# Patient Record
Sex: Male | Born: 1935 | Race: White | Hispanic: No | Marital: Married | Smoking: Former smoker
Health system: Southern US, Community
[De-identification: ages and names within clinical notes are randomized; demographics above are authoritative.]

## PROBLEM LIST (undated history)

## (undated) DIAGNOSIS — M1990 Unspecified osteoarthritis, unspecified site: Secondary | ICD-10-CM

## (undated) DIAGNOSIS — I10 Essential (primary) hypertension: Secondary | ICD-10-CM

## (undated) DIAGNOSIS — R519 Headache, unspecified: Secondary | ICD-10-CM

## (undated) DIAGNOSIS — G459 Transient cerebral ischemic attack, unspecified: Secondary | ICD-10-CM

## (undated) DIAGNOSIS — Z87442 Personal history of urinary calculi: Secondary | ICD-10-CM

## (undated) DIAGNOSIS — M109 Gout, unspecified: Secondary | ICD-10-CM

## (undated) DIAGNOSIS — R51 Headache: Secondary | ICD-10-CM

## (undated) HISTORY — PX: COLONOSCOPY W/ POLYPECTOMY: SHX1380

## (undated) HISTORY — PX: TOTAL KNEE ARTHROPLASTY: SHX125

## (undated) HISTORY — PX: COLONOSCOPY: SHX174

## (undated) HISTORY — PX: TONSILLECTOMY: SUR1361

## (undated) HISTORY — PX: FOOT SURGERY: SHX648

---

## 1989-02-22 HISTORY — PX: KNEE ARTHROSCOPY: SHX127

## 2014-02-22 ENCOUNTER — Other Ambulatory Visit: Payer: Self-pay | Admitting: Orthopedic Surgery

## 2014-04-04 ENCOUNTER — Encounter (HOSPITAL_COMMUNITY): Payer: Self-pay | Admitting: Pharmacy Technician

## 2014-04-07 NOTE — Pre-Procedure Instructions (Signed)
David Stone  04/07/2014   Your procedure is scheduled on:  Monday, October 16.  [facility].  Call this number if you have problems the morning of surgery: [phone]   Remember:   Do not eat food or drink liquids after midnight Sunday, October 25.   Take these medicines the morning of surgery with A SIP OF WATER: allopurinol (ZYLOPRIM), loratadine (CLARITIN).               May take acetaminophen (TYLENOL) if needed.              Stop taking Vitamins and Herbal Products.     Do not wear jewelry, make-up or nail polish.  Do not wear lotions, powders, or perfumes.              Men may shave face and neck.  Do not bring valuables to the hospital.              [facility] is not responsible for any belongings or valuables.               Contacts, dentures or bridgework may not be worn into surgery.  Leave suitcase in the car. After surgery it may be brought to your room.  For patients admitted to the hospital, discharge time is determined by your treatment team.               Patients discharged the day of surgery will not be allowed to drive home.  Name and phone number of your driver: -   Special Instructions: Review  [name] - Preparing For Surgery.   Please read over the following fact sheets that you were given: Pain Booklet, Coughing and Deep Breathing and Surgical Site Infection Prevention

## 2014-04-08 ENCOUNTER — Ambulatory Visit (HOSPITAL_COMMUNITY)
Admission: RE | Admit: 2014-04-08 | Discharge: 2014-04-08 | Disposition: A | Discharge status: Home / Self Care | Payer: Medicare Other | Source: Ambulatory Visit | Attending: Orthopedic Surgery | Admitting: Orthopedic Surgery

## 2014-04-08 ENCOUNTER — Encounter (HOSPITAL_COMMUNITY): Payer: Self-pay

## 2014-04-08 ENCOUNTER — Encounter (HOSPITAL_COMMUNITY)
Admission: RE | Admit: 2014-04-08 | Discharge: 2014-04-08 | Disposition: A | Discharge status: Home / Self Care | Payer: Medicare Other | Source: Ambulatory Visit | Attending: Orthopedic Surgery | Admitting: Orthopedic Surgery

## 2014-04-08 DIAGNOSIS — Z01818 Encounter for other preprocedural examination: Secondary | ICD-10-CM | POA: Insufficient documentation

## 2014-04-08 DIAGNOSIS — M25562 Pain in left knee: Secondary | ICD-10-CM | POA: Diagnosis not present

## 2014-04-08 HISTORY — DX: Headache, unspecified: R51.9

## 2014-04-08 HISTORY — DX: Essential (primary) hypertension: I10

## 2014-04-08 HISTORY — DX: Unspecified osteoarthritis, unspecified site: M19.90

## 2014-04-08 HISTORY — DX: Headache: R51

## 2014-04-08 HISTORY — DX: Personal history of urinary calculi: Z87.442

## 2014-04-08 HISTORY — DX: Gout, unspecified: M10.9

## 2014-04-08 LAB — URINALYSIS, ROUTINE W REFLEX MICROSCOPIC
Bilirubin Urine: NEGATIVE
GLUCOSE, UA: NEGATIVE mg/dL
Hgb urine dipstick: NEGATIVE
Ketones, ur: NEGATIVE mg/dL
LEUKOCYTES UA: NEGATIVE
NITRITE: NEGATIVE
PH: 6 (ref 5.0–8.0)
Protein, ur: NEGATIVE mg/dL
SPECIFIC GRAVITY, URINE: 1.021 (ref 1.005–1.030)
Urobilinogen, UA: 0.2 mg/dL (ref 0.0–1.0)

## 2014-04-08 LAB — COMPREHENSIVE METABOLIC PANEL
ALBUMIN: 3.7 g/dL (ref 3.5–5.2)
ALK PHOS: 76 U/L (ref 39–117)
ALT: 22 U/L (ref 0–53)
AST: 24 U/L (ref 0–37)
Anion gap: 12 (ref 5–15)
BUN: 25 mg/dL — ABNORMAL HIGH (ref 6–23)
CO2: 26 mEq/L (ref 19–32)
Calcium: 9.2 mg/dL (ref 8.4–10.5)
Chloride: 106 mEq/L (ref 96–112)
Creatinine, Ser: 0.76 mg/dL (ref 0.50–1.35)
GFR calc Af Amer: >90 mL/min (ref >90)
GFR calc non Af Amer: 85 mL/min — ABNORMAL LOW (ref >90)
GLUCOSE: 88 mg/dL (ref 70–99)
POTASSIUM: 4.3 meq/L (ref 3.7–5.3)
SODIUM: 144 meq/L (ref 137–147)
Total Bilirubin: 0.3 mg/dL (ref 0.3–1.2)
Total Protein: 7.3 g/dL (ref 6.0–8.3)

## 2014-04-08 LAB — PROTIME-INR
INR: 0.96 (ref 0.00–1.49)
Prothrombin Time: 12.9 seconds (ref 11.6–15.2)

## 2014-04-08 LAB — CBC WITH DIFFERENTIAL/PLATELET
BASOS ABS: 0 10*3/uL (ref 0.0–0.1)
Basophils Relative: 1 % (ref 0–1)
EOS ABS: 0.4 10*3/uL (ref 0.0–0.7)
Eosinophils Relative: 7 % — ABNORMAL HIGH (ref 0–5)
HCT: 40.2 % (ref 39.0–52.0)
Hemoglobin: 14.4 g/dL (ref 13.0–17.0)
LYMPHS ABS: 1.8 10*3/uL (ref 0.7–4.0)
Lymphocytes Relative: 31 % (ref 12–46)
MCH: 34.6 pg — ABNORMAL HIGH (ref 26.0–34.0)
MCHC: 35.8 g/dL (ref 30.0–36.0)
MCV: 96.6 fL (ref 78.0–100.0)
Monocytes Absolute: 0.5 10*3/uL (ref 0.1–1.0)
Monocytes Relative: 8 % (ref 3–12)
NEUTROS PCT: 53 % (ref 43–77)
Neutro Abs: 3.1 10*3/uL (ref 1.7–7.7)
Platelets: 147 10*3/uL — ABNORMAL LOW (ref 150–400)
RBC: 4.16 MIL/uL — AB (ref 4.22–5.81)
RDW: 13.5 % (ref 11.5–15.5)
WBC: 5.8 10*3/uL (ref 4.0–10.5)

## 2014-04-09 LAB — URINE CULTURE: Colony Count: 3000

## 2014-04-17 MED ORDER — TRANEXAMIC ACID 100 MG/ML IV SOLN
1000.0000 mg | INTRAVENOUS | Status: DC
  Filled 2014-04-17: qty 10

## 2014-04-17 MED ORDER — CHLORHEXIDINE GLUCONATE 4 % EX LIQD
60.0000 mL | Freq: Once | CUTANEOUS | Status: DC
  Filled 2014-04-17: qty 60

## 2014-04-17 MED ORDER — BUPIVACAINE LIPOSOME 1.3 % IJ SUSP
20.0000 mL | Freq: Once | INTRAMUSCULAR | Status: DC
  Filled 2014-04-17: qty 20

## 2014-04-17 MED ORDER — CEFAZOLIN SODIUM-DEXTROSE 2-3 GM-% IV SOLR
2.0000 g | INTRAVENOUS | Status: AC
  Administered 2014-04-18: 2 g via INTRAVENOUS
  Filled 2014-04-17: qty 50

## 2014-04-18 ENCOUNTER — Encounter (HOSPITAL_COMMUNITY): Payer: Medicare Other | Admitting: Certified Registered"

## 2014-04-18 ENCOUNTER — Encounter (HOSPITAL_COMMUNITY)
Admission: RE | Disposition: A | Discharge status: Home / Self Care | Payer: Self-pay | Source: Ambulatory Visit | Attending: Orthopedic Surgery

## 2014-04-18 ENCOUNTER — Ambulatory Visit (HOSPITAL_COMMUNITY): Payer: Medicare Other | Admitting: Certified Registered"

## 2014-04-18 ENCOUNTER — Inpatient Hospital Stay (HOSPITAL_COMMUNITY)
Admission: RE | Admit: 2014-04-18 | Discharge: 2014-04-19 | DRG: 465 | Disposition: A | Discharge status: Home / Self Care | Payer: Medicare Other | Source: Ambulatory Visit | Attending: Orthopedic Surgery | Admitting: Orthopedic Surgery

## 2014-04-18 ENCOUNTER — Encounter (HOSPITAL_COMMUNITY): Payer: Self-pay | Admitting: Surgery

## 2014-04-18 DIAGNOSIS — M25562 Pain in left knee: Secondary | ICD-10-CM | POA: Diagnosis present

## 2014-04-18 DIAGNOSIS — M109 Gout, unspecified: Secondary | ICD-10-CM | POA: Diagnosis present

## 2014-04-18 DIAGNOSIS — Z96652 Presence of left artificial knee joint: Secondary | ICD-10-CM | POA: Diagnosis not present

## 2014-04-18 DIAGNOSIS — M24662 Ankylosis, left knee: Principal | ICD-10-CM | POA: Diagnosis present

## 2014-04-18 DIAGNOSIS — I10 Essential (primary) hypertension: Secondary | ICD-10-CM | POA: Diagnosis present

## 2014-04-18 DIAGNOSIS — Z87891 Personal history of nicotine dependence: Secondary | ICD-10-CM

## 2014-04-18 HISTORY — PX: EXCISIONAL TOTAL KNEE ARTHROPLASTY: SHX5015

## 2014-04-18 HISTORY — DX: Transient cerebral ischemic attack, unspecified: G45.9

## 2014-04-18 LAB — CBC
HCT: 37.3 % — ABNORMAL LOW (ref 39.0–52.0)
Hemoglobin: 13.2 g/dL (ref 13.0–17.0)
MCH: 34.2 pg — AB (ref 26.0–34.0)
MCHC: 35.4 g/dL (ref 30.0–36.0)
MCV: 96.6 fL (ref 78.0–100.0)
PLATELETS: 130 10*3/uL — AB (ref 150–400)
RBC: 3.86 MIL/uL — ABNORMAL LOW (ref 4.22–5.81)
RDW: 13.5 % (ref 11.5–15.5)
WBC: 6.9 10*3/uL (ref 4.0–10.5)

## 2014-04-18 LAB — CREATININE, SERUM
Creatinine, Ser: 0.7 mg/dL (ref 0.50–1.35)
GFR calc Af Amer: >90 mL/min (ref >90)
GFR calc non Af Amer: 88 mL/min — ABNORMAL LOW (ref >90)

## 2014-04-18 SURGERY — EXCISIONAL TOTAL KNEE ARTHROPLASTY
Anesthesia: General | Laterality: Left

## 2014-04-18 MED ORDER — HYDROMORPHONE HCL 1 MG/ML IJ SOLN
1.0000 mg | INTRAMUSCULAR | Status: DC | PRN
  Administered 2014-04-19: 1 mg via INTRAVENOUS
  Filled 2014-04-18: qty 1

## 2014-04-18 MED ORDER — PROMETHAZINE HCL 25 MG/ML IJ SOLN: 6.2500 mg | INTRAMUSCULAR | Status: DC | PRN

## 2014-04-18 MED ORDER — BUPIVACAINE HCL (PF) 0.25 % IJ SOLN
INTRAMUSCULAR | Status: AC
  Filled 2014-04-18: qty 30

## 2014-04-18 MED ORDER — OXYCODONE HCL 5 MG PO TABS
5.0000 mg | ORAL_TABLET | Freq: Once | ORAL | Status: AC | PRN
  Administered 2014-04-18: 5 mg via ORAL

## 2014-04-18 MED ORDER — METHOCARBAMOL 500 MG PO TABS
500.0000 mg | ORAL_TABLET | Freq: Four times a day (QID) | ORAL | Status: DC | PRN
  Administered 2014-04-18 – 2014-04-19 (×2): 500 mg via ORAL
  Filled 2014-04-18: qty 1

## 2014-04-18 MED ORDER — OXYCODONE HCL 5 MG/5ML PO SOLN: 5.0000 mg | Freq: Once | ORAL | Status: AC | PRN

## 2014-04-18 MED ORDER — MENTHOL 3 MG MT LOZG
1.0000 | LOZENGE | OROMUCOSAL | Status: DC | PRN
Start: 2014-04-18 — End: 2014-04-19

## 2014-04-18 MED ORDER — LORATADINE 10 MG PO TABS
10.0000 mg | ORAL_TABLET | Freq: Every day | ORAL | Status: DC
  Filled 2014-04-18: qty 1

## 2014-04-18 MED ORDER — PROPOFOL 10 MG/ML IV BOLUS
INTRAVENOUS | Status: AC
  Filled 2014-04-18: qty 20

## 2014-04-18 MED ORDER — SENNOSIDES-DOCUSATE SODIUM 8.6-50 MG PO TABS: 1.0000 | ORAL_TABLET | Freq: Every evening | ORAL | Status: DC | PRN

## 2014-04-18 MED ORDER — FENTANYL CITRATE 0.05 MG/ML IJ SOLN
50.0000 ug | Freq: Once | INTRAMUSCULAR | Status: AC
  Administered 2014-04-18: 50 ug via INTRAVENOUS

## 2014-04-18 MED ORDER — LIDOCAINE HCL (CARDIAC) 20 MG/ML IV SOLN
INTRAVENOUS | Status: AC
  Filled 2014-04-18: qty 5

## 2014-04-18 MED ORDER — PHENOL 1.4 % MT LIQD: 1.0000 | OROMUCOSAL | Status: DC | PRN

## 2014-04-18 MED ORDER — ZOLPIDEM TARTRATE 5 MG PO TABS: 5.0000 mg | ORAL_TABLET | Freq: Every evening | ORAL | Status: DC | PRN

## 2014-04-18 MED ORDER — ALLOPURINOL 300 MG PO TABS
300.0000 mg | ORAL_TABLET | Freq: Every day | ORAL | Status: DC
  Administered 2014-04-19: 300 mg via ORAL
  Filled 2014-04-18: qty 1

## 2014-04-18 MED ORDER — ARTIFICIAL TEARS OP OINT
TOPICAL_OINTMENT | OPHTHALMIC | Status: AC
  Filled 2014-04-18: qty 3.5

## 2014-04-18 MED ORDER — BUPIVACAINE LIPOSOME 1.3 % IJ SUSP
INTRAMUSCULAR | Status: DC | PRN
  Administered 2014-04-18: 20 mL

## 2014-04-18 MED ORDER — ALUM & MAG HYDROXIDE-SIMETH 200-200-20 MG/5ML PO SUSP: 30.0000 mL | ORAL | Status: DC | PRN

## 2014-04-18 MED ORDER — DIPHENHYDRAMINE HCL 12.5 MG/5ML PO ELIX: 12.5000 mg | ORAL_SOLUTION | ORAL | Status: DC | PRN

## 2014-04-18 MED ORDER — BUPIVACAINE-EPINEPHRINE (PF) 0.5% -1:200000 IJ SOLN
INTRAMUSCULAR | Status: DC | PRN
Start: 2014-04-18 — End: 2014-04-18
  Administered 2014-04-18: 25 mL via PERINEURAL

## 2014-04-18 MED ORDER — FENTANYL CITRATE 0.05 MG/ML IJ SOLN
INTRAMUSCULAR | Status: AC
  Administered 2014-04-18: 50 ug via INTRAVENOUS
  Filled 2014-04-18: qty 2

## 2014-04-18 MED ORDER — METHOCARBAMOL 1000 MG/10ML IJ SOLN
500.0000 mg | Freq: Four times a day (QID) | INTRAVENOUS | Status: DC | PRN
  Filled 2014-04-18: qty 5

## 2014-04-18 MED ORDER — CELECOXIB 200 MG PO CAPS
200.0000 mg | ORAL_CAPSULE | Freq: Two times a day (BID) | ORAL | Status: DC
  Administered 2014-04-18 – 2014-04-19 (×2): 200 mg via ORAL
  Filled 2014-04-18 (×3): qty 1

## 2014-04-18 MED ORDER — LACTATED RINGERS IV SOLN
INTRAVENOUS | Status: DC
  Administered 2014-04-18 (×2): via INTRAVENOUS

## 2014-04-18 MED ORDER — 0.9 % SODIUM CHLORIDE (POUR BTL) OPTIME
TOPICAL | Status: DC | PRN
  Administered 2014-04-18: 1000 mL

## 2014-04-18 MED ORDER — ENOXAPARIN SODIUM 30 MG/0.3ML ~~LOC~~ SOLN
30.0000 mg | Freq: Two times a day (BID) | SUBCUTANEOUS | Status: DC
  Administered 2014-04-19: 30 mg via SUBCUTANEOUS
  Filled 2014-04-18 (×3): qty 0.3

## 2014-04-18 MED ORDER — MIDAZOLAM HCL 2 MG/2ML IJ SOLN
INTRAMUSCULAR | Status: AC
  Filled 2014-04-18: qty 2

## 2014-04-18 MED ORDER — ARTIFICIAL TEARS OP OINT
TOPICAL_OINTMENT | OPHTHALMIC | Status: DC | PRN
  Administered 2014-04-18: 1 via OPHTHALMIC

## 2014-04-18 MED ORDER — ONDANSETRON HCL 4 MG/2ML IJ SOLN
INTRAMUSCULAR | Status: AC
  Filled 2014-04-18: qty 2

## 2014-04-18 MED ORDER — BISACODYL 5 MG PO TBEC: 5.0000 mg | DELAYED_RELEASE_TABLET | Freq: Every day | ORAL | Status: DC | PRN

## 2014-04-18 MED ORDER — FENTANYL CITRATE 0.05 MG/ML IJ SOLN
INTRAMUSCULAR | Status: AC
  Filled 2014-04-18: qty 5

## 2014-04-18 MED ORDER — ONDANSETRON HCL 4 MG PO TABS: 4.0000 mg | ORAL_TABLET | Freq: Four times a day (QID) | ORAL | Status: DC | PRN

## 2014-04-18 MED ORDER — OXYCODONE HCL 5 MG PO TABS
ORAL_TABLET | ORAL | Status: AC
  Filled 2014-04-18: qty 1

## 2014-04-18 MED ORDER — LOSARTAN POTASSIUM 25 MG PO TABS
25.0000 mg | ORAL_TABLET | Freq: Two times a day (BID) | ORAL | Status: DC
  Administered 2014-04-18 – 2014-04-19 (×2): 25 mg via ORAL
  Filled 2014-04-18 (×3): qty 1

## 2014-04-18 MED ORDER — OXYCODONE HCL ER 10 MG PO T12A
10.0000 mg | EXTENDED_RELEASE_TABLET | Freq: Two times a day (BID) | ORAL | Status: DC
  Administered 2014-04-18 – 2014-04-19 (×2): 10 mg via ORAL
  Filled 2014-04-18 (×2): qty 1

## 2014-04-18 MED ORDER — BUPIVACAINE-EPINEPHRINE (PF) 0.25% -1:200000 IJ SOLN
INTRAMUSCULAR | Status: AC
  Filled 2014-04-18: qty 30

## 2014-04-18 MED ORDER — BUPIVACAINE-EPINEPHRINE (PF) 0.5% -1:200000 IJ SOLN
INTRAMUSCULAR | Status: DC | PRN
  Administered 2014-04-18: 30 mL

## 2014-04-18 MED ORDER — ACETAMINOPHEN 325 MG PO TABS
650.0000 mg | ORAL_TABLET | Freq: Four times a day (QID) | ORAL | Status: DC | PRN
Start: 2014-04-18 — End: 2014-04-19

## 2014-04-18 MED ORDER — FLEET ENEMA 7-19 GM/118ML RE ENEM: 1.0000 | ENEMA | Freq: Once | RECTAL | Status: AC | PRN

## 2014-04-18 MED ORDER — ONDANSETRON HCL 4 MG/2ML IJ SOLN
INTRAMUSCULAR | Status: DC | PRN
  Administered 2014-04-18: 4 mg via INTRAVENOUS

## 2014-04-18 MED ORDER — ACETAMINOPHEN 650 MG RE SUPP
650.0000 mg | Freq: Four times a day (QID) | RECTAL | Status: DC | PRN
Start: 2014-04-18 — End: 2014-04-19

## 2014-04-18 MED ORDER — SODIUM CHLORIDE 0.9 % IV SOLN
INTRAVENOUS | Status: DC
  Administered 2014-04-18: 75 mL/h via INTRAVENOUS

## 2014-04-18 MED ORDER — PHENYLEPHRINE 40 MCG/ML (10ML) SYRINGE FOR IV PUSH (FOR BLOOD PRESSURE SUPPORT)
PREFILLED_SYRINGE | INTRAVENOUS | Status: AC
  Filled 2014-04-18: qty 10

## 2014-04-18 MED ORDER — TRANEXAMIC ACID 100 MG/ML IV SOLN
1000.0000 mg | INTRAVENOUS | Status: AC
  Administered 2014-04-18: 1000 mg via INTRAVENOUS
  Filled 2014-04-18: qty 10

## 2014-04-18 MED ORDER — CEFAZOLIN SODIUM-DEXTROSE 2-3 GM-% IV SOLR
2.0000 g | Freq: Four times a day (QID) | INTRAVENOUS | Status: AC
  Administered 2014-04-19: 2 g via INTRAVENOUS
  Filled 2014-04-18 (×2): qty 50

## 2014-04-18 MED ORDER — ACETAMINOPHEN 500 MG PO TABS
1000.0000 mg | ORAL_TABLET | Freq: Four times a day (QID) | ORAL | Status: AC
  Administered 2014-04-18 – 2014-04-19 (×4): 1000 mg via ORAL
  Filled 2014-04-18 (×4): qty 2

## 2014-04-18 MED ORDER — DOCUSATE SODIUM 100 MG PO CAPS
100.0000 mg | ORAL_CAPSULE | Freq: Two times a day (BID) | ORAL | Status: DC
  Administered 2014-04-18 – 2014-04-19 (×2): 100 mg via ORAL
  Filled 2014-04-18 (×3): qty 1

## 2014-04-18 MED ORDER — HYDROMORPHONE HCL 1 MG/ML IJ SOLN: 0.2500 mg | INTRAMUSCULAR | Status: DC | PRN

## 2014-04-18 MED ORDER — NIACIN 500 MG PO TABS
500.0000 mg | ORAL_TABLET | Freq: Every day | ORAL | Status: DC
  Administered 2014-04-18: 500 mg via ORAL
  Filled 2014-04-18 (×2): qty 1

## 2014-04-18 MED ORDER — BUPIVACAINE-EPINEPHRINE (PF) 0.5% -1:200000 IJ SOLN
INTRAMUSCULAR | Status: AC
  Filled 2014-04-18: qty 30

## 2014-04-18 MED ORDER — METOCLOPRAMIDE HCL 10 MG PO TABS: 5.0000 mg | ORAL_TABLET | Freq: Three times a day (TID) | ORAL | Status: DC | PRN

## 2014-04-18 MED ORDER — METHOCARBAMOL 500 MG PO TABS
ORAL_TABLET | ORAL | Status: AC
  Filled 2014-04-18: qty 1

## 2014-04-18 MED ORDER — ROCURONIUM BROMIDE 50 MG/5ML IV SOLN
INTRAVENOUS | Status: AC
  Filled 2014-04-18: qty 1

## 2014-04-18 MED ORDER — PROPOFOL 10 MG/ML IV BOLUS
INTRAVENOUS | Status: DC | PRN
  Administered 2014-04-18 (×2): 50 mg via INTRAVENOUS
  Administered 2014-04-18: 100 mg via INTRAVENOUS

## 2014-04-18 MED ORDER — ONDANSETRON HCL 4 MG/2ML IJ SOLN: 4.0000 mg | Freq: Four times a day (QID) | INTRAMUSCULAR | Status: DC | PRN

## 2014-04-18 MED ORDER — SODIUM CHLORIDE 0.9 % IV SOLN: INTRAVENOUS | Status: DC

## 2014-04-18 MED ORDER — PHENYLEPHRINE HCL 10 MG/ML IJ SOLN
INTRAMUSCULAR | Status: DC | PRN
  Administered 2014-04-18: 120 ug via INTRAVENOUS
  Administered 2014-04-18 (×2): 80 ug via INTRAVENOUS

## 2014-04-18 MED ORDER — OXYCODONE HCL 5 MG PO TABS
5.0000 mg | ORAL_TABLET | ORAL | Status: DC | PRN
  Administered 2014-04-18 – 2014-04-19 (×4): 10 mg via ORAL
  Filled 2014-04-18 (×4): qty 2

## 2014-04-18 MED ORDER — FENTANYL CITRATE 0.05 MG/ML IJ SOLN
INTRAMUSCULAR | Status: DC | PRN
  Administered 2014-04-18 (×3): 50 ug via INTRAVENOUS

## 2014-04-18 MED ORDER — METOCLOPRAMIDE HCL 5 MG/ML IJ SOLN: 5.0000 mg | Freq: Three times a day (TID) | INTRAMUSCULAR | Status: DC | PRN

## 2014-04-18 SURGICAL SUPPLY — 63 items
ATTUNE PSRP INSR SZ8 6 KNEE (Insert) ×2 IMPLANT
ATTUNE PSRP INSR SZ8 6MM KNEE (Insert) ×1 IMPLANT
BAG URINE DRAINAGE (UROLOGICAL SUPPLIES) IMPLANT
BANDAGE ESMARK 6X9 LF (GAUZE/BANDAGES/DRESSINGS) ×1 IMPLANT
BLADE SAW SGTL 83.5X18.5 (BLADE) IMPLANT
BLADE SAW SGTL NAR THIN XSHT (BLADE) IMPLANT
BNDG ESMARK 6X9 LF (GAUZE/BANDAGES/DRESSINGS) ×3
BOWL SMART MIX CTS (DISPOSABLE) IMPLANT
CONT SPEC 4OZ CLIKSEAL STRL BL (MISCELLANEOUS) IMPLANT
COVER BACK TABLE 24X17X13 BIG (DRAPES) IMPLANT
COVER SURGICAL LIGHT HANDLE (MISCELLANEOUS) ×3 IMPLANT
CUFF TOURNIQUET SINGLE 34IN LL (TOURNIQUET CUFF) ×3 IMPLANT
DRAPE EXTREMITY T 121X128X90 (DRAPE) ×3 IMPLANT
DRAPE INCISE IOBAN 66X45 STRL (DRAPES) ×6 IMPLANT
DRAPE PROXIMA HALF (DRAPES) ×3 IMPLANT
DRAPE U-SHAPE 47X51 STRL (DRAPES) ×3 IMPLANT
DRSG ADAPTIC 3X8 NADH LF (GAUZE/BANDAGES/DRESSINGS) ×3 IMPLANT
DRSG PAD ABDOMINAL 8X10 ST (GAUZE/BANDAGES/DRESSINGS) ×3 IMPLANT
DURAPREP 26ML APPLICATOR (WOUND CARE) ×6 IMPLANT
ELECT REM PT RETURN 9FT ADLT (ELECTROSURGICAL) ×3
ELECTRODE REM PT RTRN 9FT ADLT (ELECTROSURGICAL) ×1 IMPLANT
EVACUATOR 1/8 PVC DRAIN (DRAIN) IMPLANT
GAUZE SPONGE 4X4 12PLY STRL (GAUZE/BANDAGES/DRESSINGS) ×3 IMPLANT
GEL ULTRASOUND 20GR AQUASONIC (MISCELLANEOUS) IMPLANT
GLOVE BIOGEL PI IND STRL 7.5 (GLOVE) IMPLANT
GLOVE BIOGEL PI IND STRL 8.5 (GLOVE) ×2 IMPLANT
GLOVE BIOGEL PI INDICATOR 7.5 (GLOVE)
GLOVE BIOGEL PI INDICATOR 8.5 (GLOVE) ×4
GLOVE SURG ORTHO 7.0 STRL STRW (GLOVE) IMPLANT
GLOVE SURG ORTHO 8.0 STRL STRW (GLOVE) ×6 IMPLANT
GOWN STRL REUS W/ TWL LRG LVL3 (GOWN DISPOSABLE) ×2 IMPLANT
GOWN STRL REUS W/ TWL XL LVL3 (GOWN DISPOSABLE) ×2 IMPLANT
GOWN STRL REUS W/TWL LRG LVL3 (GOWN DISPOSABLE) ×4
GOWN STRL REUS W/TWL XL LVL3 (GOWN DISPOSABLE) ×4
HANDPIECE INTERPULSE COAX TIP (DISPOSABLE) ×2
HOOD PEEL AWAY FACE SHEILD DIS (HOOD) ×12 IMPLANT
KIT BASIN OR (CUSTOM PROCEDURE TRAY) ×3 IMPLANT
KIT ROOM TURNOVER OR (KITS) ×3 IMPLANT
MANIFOLD NEPTUNE II (INSTRUMENTS) ×3 IMPLANT
NEEDLE 18GX1X1/2 (RX/OR ONLY) (NEEDLE) IMPLANT
NEEDLE HYPO 25GX1X1/2 BEV (NEEDLE) ×3 IMPLANT
NS IRRIG 1000ML POUR BTL (IV SOLUTION) ×3 IMPLANT
PACK TOTAL JOINT (CUSTOM PROCEDURE TRAY) ×3 IMPLANT
PAD ARMBOARD 7.5X6 YLW CONV (MISCELLANEOUS) ×9 IMPLANT
PADDING CAST COTTON 6X4 STRL (CAST SUPPLIES) ×3 IMPLANT
SET HNDPC FAN SPRY TIP SCT (DISPOSABLE) ×1 IMPLANT
SPONGE GAUZE 4X4 12PLY STER LF (GAUZE/BANDAGES/DRESSINGS) ×3 IMPLANT
STAPLER VISISTAT 35W (STAPLE) ×3 IMPLANT
SUCTION FRAZIER TIP 10 FR DISP (SUCTIONS) ×3 IMPLANT
SUT VIC AB 0 CTB1 27 (SUTURE) ×6 IMPLANT
SUT VIC AB 1 CT1 27 (SUTURE) ×8
SUT VIC AB 1 CT1 27XBRD ANBCTR (SUTURE) ×4 IMPLANT
SUT VIC AB 2-0 CT1 27 (SUTURE) ×4
SUT VIC AB 2-0 CT1 TAPERPNT 27 (SUTURE) ×2 IMPLANT
SYR 20CC LL (SYRINGE) IMPLANT
SYR 20ML ECCENTRIC (SYRINGE) IMPLANT
SYR CONTROL 10ML LL (SYRINGE) ×6 IMPLANT
SYRINGE 10CC LL (SYRINGE) IMPLANT
TOWEL OR 17X24 6PK STRL BLUE (TOWEL DISPOSABLE) ×3 IMPLANT
TOWEL OR 17X26 10 PK STRL BLUE (TOWEL DISPOSABLE) ×3 IMPLANT
TRAY FOLEY CATH 16FRSI W/METER (SET/KITS/TRAYS/PACK) ×3 IMPLANT
TUBE ANAEROBIC SPECIMEN COL (MISCELLANEOUS) IMPLANT
WATER STERILE IRR 1000ML POUR (IV SOLUTION) IMPLANT

## 2014-04-18 NOTE — Progress Notes (Signed)
Orthopedic Tech Progress Note Patient Details:  David MornJohn Jansma August 26, 1933 [id number] Patient did not want overhead frame. Patient ID: David Stone, male   DOB: August 26, 1933, 80 y.o.   MRN: [id number]   [name], [name] 04/18/2014, 7:10 PM

## 2014-04-18 NOTE — Anesthesia Postprocedure Evaluation (Signed)
  Anesthesia Post-op Note  Patient: David Stone  Procedure(s) Performed: Procedure(s): EXCISIONAL TOTAL KNEE ARTHROPLASTY POLYEXCHANGE WITH SCAR TISSUE DEBRIDEMENT (Left)  Patient Location: PACU  Anesthesia Type:GA combined with regional for post-op pain  Level of Consciousness: awake, alert  and oriented  Airway and Oxygen Therapy: Patient Spontanous Breathing  Post-op Pain: none  Post-op Assessment: Post-op Vital signs reviewed  Post-op Vital Signs: Reviewed  Last Vitals:  Filed Vitals:   04/18/14 1330  BP:   Pulse: 63  Temp:   Resp: 13    Complications: No apparent anesthesia complications

## 2014-04-18 NOTE — Anesthesia Preprocedure Evaluation (Addendum)
Anesthesia Evaluation  Patient identified by MRN, date of birth, ID band Patient awake    Reviewed: Allergy & Precautions, H&P , NPO status , Patient's Chart, lab work & pertinent test results  Airway Mallampati: II  TM Distance: >3 FB Neck ROM: Full    Dental  (+) Teeth Intact, Dental Advisory Given   Pulmonary former smoker,          Cardiovascular hypertension, Pt. on medications     Neuro/Psych  Headaches, CVA negative psych ROS   GI/Hepatic negative GI ROS, Neg liver ROS,   Endo/Other  negative endocrine ROS  Renal/GU negative Renal ROS     Musculoskeletal  (+) Arthritis -,   Abdominal   Peds  Hematology negative hematology ROS (+)   Anesthesia Other Findings   Reproductive/Obstetrics                           Anesthesia Physical Anesthesia Plan  ASA: II  Anesthesia Plan: General   Post-op Pain Management:    Induction: Intravenous  Airway Management Planned: LMA  Additional Equipment:   Intra-op Plan:   Post-operative Plan:   Informed Consent: I have reviewed the patients History and Physical, chart, labs and discussed the procedure including the risks, benefits and alternatives for the proposed anesthesia with the patient or authorized representative who has indicated his/her understanding and acceptance.   Dental advisory given  Plan Discussed with: CRNA and Surgeon  Anesthesia Plan Comments:        Anesthesia Quick Evaluation

## 2014-04-18 NOTE — Anesthesia Procedure Notes (Addendum)
Anesthesia Regional Block:  Adductor canal block  Pre-Anesthetic Checklist: ,, timeout performed, Correct Patient, Correct Site, Correct Laterality, Correct Procedure, Correct Position, site marked, Risks and benefits discussed,  Surgical consent,  Pre-op evaluation,  At surgeon's request and post-op pain management  Laterality: Left  Prep: chloraprep       Needles:   Needle Type: Echogenic Needle     Needle Length: 9cm 9 cm Needle Gauge: 21 and 21 G    Additional Needles:  Procedures: ultrasound guided (picture in chart) Adductor canal block Narrative:  Start time: 04/18/2014 10:10 AM End time: 04/18/2014 10:18 AM Injection made incrementally with aspirations every 5 mL.  Performed by: Personally  Anesthesiologist: Marcene Duosobert Fitzgerald, MD  Additional Notes: Risks and benefits explained to pt. Prepped and draped in sterile fashion. Pt tolerated procedure well with no immediate complications. Total 25cc's 0.5% bupi with 1:200k epi.   Procedure Name: LMA Insertion Date/Time: 04/18/2014 10:31 AM Performed by: Jefm MilesENNIE, Arsenia Goracke E Pre-anesthesia Checklist: Patient identified, Emergency Drugs available, Suction available, Patient being monitored and Timeout performed Patient Re-evaluated:Patient Re-evaluated prior to inductionOxygen Delivery Method: Circle system utilized Preoxygenation: Pre-oxygenation with 100% oxygen Intubation Type: IV induction Ventilation: Mask ventilation without difficulty LMA: LMA inserted LMA Size: 5.0 Number of attempts: 1 Placement Confirmation: positive ETCO2 and breath sounds checked- equal and bilateral Tube secured with: Tape Dental Injury: Teeth and Oropharynx as per pre-operative assessment

## 2014-04-18 NOTE — H&P (Signed)
  David MornJohn Stone MRN:  [id number] DOB/SEX:  [birth year]/male  CHIEF COMPLAINT:  Painful left Knee  HISTORY: Patient is a 80 y.o. male presented with a history of pain in the left knee. Onset of symptoms was abrupt starting several months ago with rapidly worsening course since that time. Prior procedures on the knee include arthroplasty. Patient has been treated conservatively with over-the-counter NSAIDs and activity modification. Patient currently rates pain in the knee at 10 out of 10 with activity. There is pain at night.  PAST MEDICAL HISTORY: There are no active problems to display for this patient.  Past Medical History  Diagnosis Date  . Hypertension   . Stroke     TIA- cant raise Right arm   after 2006  . History of kidney stones   . Gout   . Arthritis   . Headache     after 63.   Past Surgical History  Procedure Laterality Date  . Foot surgery Right     x 2 for gout  . Colonoscopy    . Tonsillectomy    . Colonoscopy w/ polypectomy    . Knee arthroscopy  1990's    after injury     MEDICATIONS:   No prescriptions prior to admission    ALLERGIES:  No Known Allergies  REVIEW OF SYSTEMS:  A comprehensive review of systems was negative.   FAMILY HISTORY:  No family history on file.  SOCIAL HISTORY:   History  Substance Use Topics  . Smoking status: Former Smoker    Types: Cigars  . Smokeless tobacco: Not on file  . Alcohol Use: No     EXAMINATION:  Vital signs in last 24 hours:    General appearance: alert, cooperative and no distress Lungs: clear to auscultation bilaterally Heart: regular rate and rhythm, S1, S2 normal, no murmur, click, rub or gallop Abdomen: soft, non-tender; bowel sounds normal; no masses,  no organomegaly Extremities: extremities normal, atraumatic, no cyanosis or edema and Homans sign is negative, no sign of DVT Pulses: 2+ and symmetric Skin: Skin color, texture, turgor normal. No rashes or lesions  Musculoskeletal:  ROM 0-90,  Ligaments intact,  Imaging Review Plain radiographs demonstrate components in good alignment  Assessment/Plan: Left knee pain s/p tka   The patient has failed conservative treatment.  The clearance notes were reviewed.  After discussion with the patient it was felt that poly swap scar tissue debridement was indicated. The procedure,  risks, and benefits of total knee arthroplasty were presented and reviewed. The risks including but not limited to aseptic loosening, infection, blood clots, vascular injury, stiffness, patella tracking problems complications among others were discussed. The patient acknowledged the explanation, agreed to proceed with the plan.  David Stone 04/18/2014, 6:41 AM

## 2014-04-18 NOTE — Plan of Care (Signed)
Problem: Consults Goal: Diagnosis- Total Joint Replacement Primary Total Knee  L total knee replacement, poly exchange with scar tissue debridement

## 2014-04-18 NOTE — Therapy (Signed)
04/18/2014, 9:37 AM   DRUG ALERT  :  Re: IV Tranexamic Acid for surgery.    Per interview with patient and wife, Patient reports he did NOT have a CVA/TIA previously [2006].  It was never established, by his physician, that he had any evidence of an embolic episode as mentioned in a previous note.  This was mentioned once in a medical history, but has since, been dropped from ongoing medical records.  No contraindications noted for receiving IV Tranexamic acid as ordered for surgery.  Thank you.  Joi Leyva, Elisha HeadlandEarle J,  Pharm.D.

## 2014-04-18 NOTE — Progress Notes (Signed)
Orthopedic Tech Progress Note Patient Details:  David MornJohn Kuzniar [birth year] [id number] Applied CPM to LLE.  Left Bone Foam with pt.'s nurse. CPM Left Knee CPM Left Knee: On Left Knee Flexion (Degrees): 90 Left Knee Extension (Degrees): 0   [name], [name] 04/18/2014, 3:54 PM

## 2014-04-18 NOTE — Transfer of Care (Signed)
Immediate Anesthesia Transfer of Care Note  Patient: David Stone  Procedure(s) Performed: Procedure(s): EXCISIONAL TOTAL KNEE ARTHROPLASTY POLYEXCHANGE WITH SCAR TISSUE DEBRIDEMENT (Left)  Patient Location: PACU  Anesthesia Type:General  Level of Consciousness: awake  Airway & Oxygen Therapy: Patient Spontanous Breathing and Patient connected to nasal cannula oxygen  Post-op Assessment: Report given to PACU RN  Post vital signs: Reviewed and stable  Complications: No apparent anesthesia complications

## 2014-04-18 NOTE — Progress Notes (Signed)
Utilization review completed.  

## 2014-04-19 ENCOUNTER — Encounter (HOSPITAL_COMMUNITY): Payer: Self-pay | Admitting: General Practice

## 2014-04-19 LAB — BASIC METABOLIC PANEL
Anion gap: 13 (ref 5–15)
BUN: 17 mg/dL (ref 6–23)
CALCIUM: 8.6 mg/dL (ref 8.4–10.5)
CO2: 25 meq/L (ref 19–32)
Chloride: 105 mEq/L (ref 96–112)
Creatinine, Ser: 0.82 mg/dL (ref 0.50–1.35)
GFR calc Af Amer: >90 mL/min (ref >90)
GFR, EST NON AFRICAN AMERICAN: 83 mL/min — AB (ref >90)
GLUCOSE: 100 mg/dL — AB (ref 70–99)
Potassium: 3.9 mEq/L (ref 3.7–5.3)
SODIUM: 143 meq/L (ref 137–147)

## 2014-04-19 LAB — CBC
HCT: 34.1 % — ABNORMAL LOW (ref 39.0–52.0)
HEMOGLOBIN: 11.9 g/dL — AB (ref 13.0–17.0)
MCH: 34.6 pg — AB (ref 26.0–34.0)
MCHC: 34.9 g/dL (ref 30.0–36.0)
MCV: 99.1 fL (ref 78.0–100.0)
Platelets: 118 10*3/uL — ABNORMAL LOW (ref 150–400)
RBC: 3.44 MIL/uL — ABNORMAL LOW (ref 4.22–5.81)
RDW: 13.5 % (ref 11.5–15.5)
WBC: 5.6 10*3/uL (ref 4.0–10.5)

## 2014-04-19 MED ORDER — OXYCODONE HCL ER 10 MG PO T12A: 10.0000 mg | EXTENDED_RELEASE_TABLET | Freq: Two times a day (BID) | ORAL | Status: AC

## 2014-04-19 MED ORDER — METHOCARBAMOL 500 MG PO TABS: 500.0000 mg | ORAL_TABLET | Freq: Four times a day (QID) | ORAL | Status: AC | PRN

## 2014-04-19 MED ORDER — ENOXAPARIN SODIUM 40 MG/0.4ML ~~LOC~~ SOLN: 40.0000 mg | SUBCUTANEOUS | Status: AC

## 2014-04-19 MED ORDER — OXYCODONE HCL 5 MG PO TABS: 5.0000 mg | ORAL_TABLET | ORAL | Status: AC | PRN

## 2014-04-19 NOTE — Evaluation (Signed)
Occupational Therapy Evaluation Patient Details Name: David Stone MRN: [id number] DOB: 1934/05/29 Today's Date: 04/19/2014    History of Present Illness [name]   Clinical Impression   Pt admitted with the above diagnoses and presents with below problem list. Pt seen for acute OT eval and treat. PTA pt was independent with ADLs. Pt currently at supervision to min guard level with LB ADLs. ADL education provided to pt and spouse. No further OT needs indicated at this time.     Follow Up Recommendations  Supervision - Intermittent;No OT follow up    Equipment Recommendations  None recommended by OT;Other (comment) (pt has BSC)    Recommendations for Other Services       Precautions / Restrictions Precautions Precautions: Fall Precaution Comments: reviewed  Restrictions Weight Bearing Restrictions: Yes LLE Weight Bearing: Weight bearing as tolerated      Mobility Bed Mobility Overal bed mobility: Needs Assistance Bed Mobility: Supine to Sit     Supine to sit: Supervision     General bed mobility comments: up in chair  Transfers Overall transfer level: Needs assistance Equipment used: Rolling walker (2 wheeled) Transfers: Sit to/from Stand Sit to Stand: Supervision         General transfer comment: cues for hand placement    Balance Overall balance assessment: Needs assistance         Standing balance support: Bilateral upper extremity supported;During functional activity Standing balance-Leahy Scale: Fair                              ADL Overall ADL's : Needs assistance/impaired Eating/Feeding: Set up;Sitting   Grooming: Set up;Sitting;Standing   Upper Body Bathing: Set up;Sitting   Lower Body Bathing: Min guard;Sit to/from stand   Upper Body Dressing : Set up;Sitting   Lower Body Dressing: Min guard;Sit to/from stand;With adaptive equipment   Toilet Transfer: Min guard;Ambulation;RW (3n1 over  toilet)   Toileting- Clothing Manipulation and Hygiene: Min guard;Sit to/from stand   Tub/ Shower Transfer: Min guard;Ambulation;3 in 1;Rolling walker;Shower seat   Functional mobility during ADLs: Min guard;Rolling walker General ADL Comments: ADL education provided to pt and spouse. Discussed home set up and toilet/shower transfers.      Vision                     Perception     Praxis      Pertinent Vitals/Pain Pain Assessment: 0-10 Pain Score: 5  Pain Location: L knee Pain Descriptors / Indicators: Aching Pain Intervention(s): Limited activity within patient's tolerance;Monitored during session;Repositioned;Patient requesting pain meds-RN notified     Hand Dominance     Extremity/Trunk Assessment Upper Extremity Assessment Upper Extremity Assessment: Overall WFL for tasks assessed   Lower Extremity Assessment Lower Extremity Assessment: Defer to PT evaluation    Cervical / Trunk Assessment    Communication Communication Communication: No difficulties   Cognition Arousal/Alertness: Awake/alert Behavior During Therapy: WFL for tasks assessed/performed Overall Cognitive Status: Within Functional Limits for tasks assessed                     General Comments       Exercises       Shoulder Instructions      Home Living Family/patient expects to be discharged to:: Private residence Living Arrangements: Spouse/significant other Available Help at Discharge: Family;Available PRN/intermittently Type of Home: House Home Access: Stairs to enter Entergy CorporationEntrance Stairs-Number  of Steps: 3 Entrance Stairs-Rails: None Home Layout: Two level;Able to live on main level with bedroom/bathroom     Bathroom Shower/Tub: Producer, television/film/videoWalk-in shower   Bathroom Toilet: Standard Bathroom Accessibility: Yes How Accessible: Accessible via walker Home Equipment: Walker - 2 wheels;Bedside commode;Cane - single point;Shower seat - built Designer, fashion/clothingin;Adaptive equipment Adaptive Equipment:  Reacher        Prior Functioning/Environment Level of Independence: Independent             OT Diagnosis: Acute pain   OT Problem List: Impaired balance (sitting and/or standing);Decreased knowledge of use of DME or AE;Decreased knowledge of precautions;Pain   OT Treatment/Interventions:      OT Goals(Current goals can be found in the care plan section) Acute Rehab OT Goals   OT Frequency:     Barriers to D/C:            Co-evaluation              End of Session Equipment Utilized During Treatment: Gait belt;Rolling walker CPM Left Knee Additional Comments: off at 1245  Activity Tolerance: Patient tolerated treatment well Patient left: in chair;with call bell/phone within reach;with family/visitor present   Time: 7829-56211247-1304 OT Time Calculation (min): 17 min Charges:  OT General Charges $OT Visit: 1 Procedure OT Evaluation $Initial OT Evaluation Tier I: 1 Procedure OT Treatments $Self Care/Home Management : 8-22 mins G-Codes:    Pilar GrammesMathews, Janiyah Beery H 04/19/2014, 1:11 PM

## 2014-04-19 NOTE — Discharge Instructions (Signed)
Diet: As you were doing prior to hospitalization  ° °Activity:  Increase activity slowly as tolerated  °                No lifting or driving for 6 weeks ° °Shower:  May shower without a dressing once there is no drainage from your wound. °                Do NOT wash over the wound. °                °Dressing:  You may change your dressing on Wednesday °                   Then change the dressing daily with sterile 4"x4"s gauze dressing  °                   And TED hose for knees. ° °Weight Bearing:  Weight bearing as tolerated as taught in physical therapy.  Use a                                walker or Crutches as instructed. ° °To prevent constipation: you may use a stool softener such as - °              Colace ( over the counter) 100 mg by mouth twice a day  °              Drink plenty of fluids ( prune juice may be helpful) and high fiber foods °               Miralax ( over the counter) for constipation as needed.   ° °Precautions:  If you experience chest pain or shortness of breath - call 911 immediately               For transfer to the hospital emergency department!! °              If you develop a fever greater that 101 F, purulent drainage from wound,                             increased redness or drainage from wound, or calf pain -- Call the office. ° °Follow- Up Appointment:  Please call for an appointment to be seen on 05/03/14 °                                             [place] office:  [phone] °           [address] [place], [place] [address] °               ° ° °

## 2014-04-19 NOTE — Care Management Note (Signed)
CARE MANAGEMENT NOTE 04/19/2014  Patient:  Janee MornCKERMAN,Jaramie   Account Number:  [id number]  Date Initiated:  04/19/2014  Documentation initiated by:  Vance PeperBRADY,Dima Mini  Subjective/Objective Assessment:   80 yr old male admitted with arthrofirosis of left total knee. Patient underwent a left total knee poly exchange.     Action/Plan:   Case manager spoke with patient and wife. [facility], No changes.  Patient has rolling walker and 3in1.   Anticipated DC Date:  04/19/2014   Anticipated DC Plan:  HOME W HOME HEALTH SERVICES      DC Planning Services  CM consult      [facility]  [facility]ALTH  DURABLE MEDICAL EQUIPMENT   Choice offered to / List presented to:  C-1 Patient   DME arranged  CPM      DME agency  [employer]     HH arranged  HH-2 PT      [facility]  [facility]   Status of service:  Completed, signed off Medicare Important Message given?  NA - LOS <3 / Initial given by admissions (If response is "NO", the following Medicare IM given date fields will be blank) Date Medicare IM given:   Medicare IM given by:   Date Additional Medicare IM given:   Additional Medicare IM given by:    Discharge Disposition:  HOME W HOME HEALTH SERVICES  Per UR Regulation:  Reviewed for med. necessity/level of care/duration of stay  If discussed at Long Length of Stay Meetings, dates discussed:

## 2014-04-19 NOTE — Progress Notes (Signed)
Physical Therapy Treatment Patient Details Name: David Stone MRN: [id number] DOB: [birth year] Today's Date: 04/19/2014    History of Present Illness [name]    PT Comments    *Stair training completed. Pt ambulated 325' with RW independently. He is ready to DC home from PT standpoint. **  Follow Up Recommendations  Home health PT     Equipment Recommendations  None recommended by PT    Recommendations for Other Services       Precautions / Restrictions Precautions Precautions: Fall Precaution Comments: reviewed  Restrictions Weight Bearing Restrictions: Yes LLE Weight Bearing: Weight bearing as tolerated    Mobility  Bed Mobility Overal bed mobility: Modified Independent Bed Mobility: Sit to Supine     Supine to sit: Supervision Sit to supine: Modified independent (Device/Increase time)   General bed mobility comments: with rail  Transfers Overall transfer level: Modified independent Equipment used: Rolling walker (2 wheeled) Transfers: Sit to/from Stand Sit to Stand: Modified independent (Device/Increase time)            Ambulation/Gait Ambulation/Gait assistance: Modified independent (Device/Increase time) Ambulation Distance (Feet): 325 Feet Assistive device: Rolling walker (2 wheeled) Gait Pattern/deviations: Step-through pattern Gait velocity: WFL   General Gait Details: cues for posture   Stairs Stairs: Yes Stairs assistance: Supervision Stair Management: No rails;With walker;Step to pattern;Backwards Number of Stairs: 2 General stair comments: cues for technique  Wheelchair Mobility    Modified Rankin (Stroke Patients Only)       Balance Overall balance assessment: Needs assistance         Standing balance support: Bilateral upper extremity supported;During functional activity Standing balance-Leahy Scale: Fair                      Cognition Arousal/Alertness: Awake/alert Behavior  During Therapy: WFL for tasks assessed/performed Overall Cognitive Status: Within Functional Limits for tasks assessed                      Exercises      General Comments        Pertinent Vitals/Pain Pain Assessment: 0-10 Pain Score: 4  Pain Location: L knee Pain Descriptors / Indicators: Sore Pain Intervention(s): Limited activity within patient's tolerance;Monitored during session;Premedicated before session    Home Living Family/patient expects to be discharged to:: Private residence Living Arrangements: Spouse/significant other Available Help at Discharge: Family;Available PRN/intermittently Type of Home: House Home Access: Stairs to enter Entrance Stairs-Rails: None Home Layout: Two level;Able to live on main level with bedroom/bathroom Home Equipment: Dan HumphreysWalker - 2 wheels;Bedside commode;Cane - single point;Shower seat - built in;Adaptive equipment      Prior Function Level of Independence: Independent          PT Goals (current goals can now be found in the care plan section) Acute Rehab PT Goals Patient Stated Goal: ballroom/square dancing, fishing PT Goal Formulation: With patient/family Time For Goal Achievement: 05/03/14 Potential to Achieve Goals: Good Progress towards PT goals: Progressing toward goals    Frequency  7X/week    PT Plan Current plan remains appropriate    Co-evaluation             End of Session Equipment Utilized During Treatment: Gait belt Activity Tolerance: Patient tolerated treatment well Patient left: with call bell/phone within reach;with family/visitor present;in bed     Time: 1349-1404 PT Time Calculation (min): 15 min  Charges:  $Gait Training: 8-22 mins  G CodesRalene Bathe:      Ambera Fedele Kistler 04/19/2014, 2:09 PM [phone]

## 2014-04-19 NOTE — Progress Notes (Signed)
Lovenox teaching done with the Pt and his wife. They verbalized understanding of how to admin injection and felt comfortable with admin at discharge.

## 2014-04-19 NOTE — Progress Notes (Signed)
SPORTS MEDICINE AND JOINT REPLACEMENT  David SpurlingStephen Lucey, MD   David CabalMaurice Jamillia Closson, PA-C [address], [place], [place]  [address]                             [phone]   PROGRESS NOTE  Subjective:  negative for Chest Pain  negative for Shortness of Breath  negative for Nausea/Vomiting   negative for Calf Pain  negative for Bowel Movement   Tolerating Diet: yes         Patient reports pain as 4 on 0-10 scale.    Objective: Vital signs in last 24 hours:   Patient Vitals for the past 24 hrs:  BP Temp Pulse Resp SpO2  04/19/14 1219 119/48 mmHg 97.5 F (36.4 C) 72 18 97 %  04/19/14 1120 - - - 18 -  04/19/14 0800 - - - 18 -  04/19/14 0630 132/58 mmHg 98.5 F (36.9 C) 72 18 97 %  04/19/14 0400 - - - 18 97 %  04/19/14 0000 - - - 18 97 %  04/18/14 2000 - - - 18 97 %  04/18/14 1948 130/59 mmHg 98.5 F (36.9 C) 69 18 97 %  04/18/14 1655 123/55 mmHg 98.1 F (36.7 C) 73 18 96 %  04/18/14 1615 - - 65 18 97 %  04/18/14 1609 129/66 mmHg - - - -  04/18/14 1600 - 98.1 F (36.7 C) 63 18 97 %  04/18/14 1552 133/55 mmHg - - - -  04/18/14 1515 - - 72 19 98 %  04/18/14 1509 158/116 mmHg - - - -  04/18/14 1430 - - 62 18 99 %  04/18/14 1424 124/62 mmHg - - - -  04/18/14 1405 - - 62 16 100 %  04/18/14 1339 152/72 mmHg - 66 14 100 %  04/18/14 1330 - - 63 13 100 %    @flow {1959:LAST@   Intake/Output from previous day:   10/26 0701 - 10/27 0700 In: 2280 [P.O.:730; I.V.:1550] Out: 800 [Urine:500; Drains:200]   Intake/Output this shift:   10/27 0701 - 10/27 1900 In: 360 [P.O.:360] Out: 150 [Drains:150]   Intake/Output     10/26 0701 - 10/27 0700 10/27 0701 - 10/28 0700   P.O. 730 360   I.V. 1550    Total Intake 2280 360   Urine 500    Drains 200 150   Blood 100    Total Output 800 150   Net +1480 +210        Urine Occurrence 2 x       LABORATORY DATA:  Recent Labs  04/18/14 1818 04/19/14 0605  WBC 6.9 5.6  HGB 13.2 11.9*  HCT 37.3* 34.1*  PLT 130* 118*     Recent Labs  04/18/14 1818 04/19/14 0605  NA  --  143  K  --  3.9  CL  --  105  CO2  --  25  BUN  --  17  CREATININE 0.70 0.82  GLUCOSE  --  100*  CALCIUM  --  8.6   Lab Results  Component Value Date   INR 0.96 04/08/2014    Examination:  General appearance: alert, cooperative and no distress Extremities: Homans sign is negative, no sign of DVT  Wound Exam: clean, dry, intact   Drainage:  None: wound tissue dry  Motor Exam: EHL and FHL Intact  Sensory Exam: Deep Peroneal normal   Assessment:    1 Day Post-Op  Procedure(s) (LRB): EXCISIONAL TOTAL KNEE ARTHROPLASTY POLYEXCHANGE WITH SCAR TISSUE DEBRIDEMENT (Left)  ADDITIONAL DIAGNOSIS:  Active Problems:   Left knee pain  Acute Blood Loss Anemia   Plan: Physical Therapy as ordered Weight Bearing as Tolerated (WBAT)  DVT Prophylaxis:  Lovenox  DISCHARGE PLAN: Home  DISCHARGE NEEDS: HHPT, CPM, Walker and 3-in-1 comode seat         David Stone 04/19/2014, 1:27 PM

## 2014-04-19 NOTE — Op Note (Signed)
NAME:  David Stone, David Stone               ACCOUNT NO.:  [id number]  MEDICAL RECORD NO.:  [id number]  LOCATION:  [id number]                        FACILITY:  [facility]  PHYSICIAN:  Mila HomerStephen D. Sherlean FootLucey, M.D. DATE OF BIRTH:  05-Jul-1933  DATE OF PROCEDURE:  04/18/2014 DATE OF DISCHARGE:                              OPERATIVE REPORT   SURGEON:  Mila HomerStephen D. Sherlean FootLucey, M.D.  ASSISTANT:  Altamese CabalMaurice Jones, PA-C.  ANESTHESIA:  General.  PREOPERATIVE DIAGNOSIS:  Left failed total knee arthroplasty.  POSTOPERATIVE DIAGNOSIS:  Left failed total knee arthroplasty.  PROCEDURE:  Left knee arthroscopy with open synovectomy and revision 1 component.  INDICATION FOR PROCEDURE:  The patient is a 80 year old couple years status post a primary knee replacement.  He has left foot painful arthrofibrosis and informed consent was obtained.  DESCRIPTION OF PROCEDURE:  The patient was laid supine, administered general anesthesia left leg prepped and draped in usual fashion.  The extremity was exsanguinated with Esmarch and tourniquet inflated to 350 mmHg.  I then incised the incision along the length of the old incision with #10 blade.  I used the new 10 blade to make a median parapatellar arthrotomy.  I then used a #10 blade to perform a complete synovectomy in the joint, there was lots and lots of scar tissue.  At this point, I removed the polyethylene to get to the back of the knee and removed further scar tissue there.  I then irrigated, I placed another polyethylene back in the knee.  I then trialed with multiple sizes and I went down 1 size from 7 mm to 6 mm which I think helped to eliminate the 10-degree extension contracture and I did not feel it compromised our flexion gap and then once we had irrigated and then closed, his range of motion postoperatively was approximately 3-5 degrees from terminal extension back to at least 110 degrees if not 115 degrees of flexion versus his preoperative motion which was 10-80.   Again, once lavage was done, I then closed the arthrotomy with figure-of-eight #1 Vicryl sutures, buried 0 Vicryl sutures, subcuticular 2-0 Vicryl stitches, and skin staples.  Dressed with Xeroform, dressing sponges, sterile Webril, and Ted stocking.  COMPLICATIONS:  None.  DRAINS:  None.          ______________________________ Mila HomerStephen D. Sherlean FootLucey, M.D.     SDL/MEDQ  D:  04/19/2014  T:  04/19/2014  Job:  [id number]

## 2014-04-19 NOTE — Op Note (Signed)
Dictation Number:  [id number]

## 2014-04-19 NOTE — Evaluation (Addendum)
Physical Therapy Evaluation Patient Details Name: David Stone MRN: [id number] DOB: 10/03/1933 Today's Date: 04/19/2014   History of Present Illness  [name]; h/o L TKA October 2014  Clinical Impression  **Pt admitted with *[name]*. Pt currently with functional limitations due to the deficits listed below (see PT Problem List).  Pt will benefit from skilled PT to increase their independence and safety with mobility to allow discharge to the venue listed below.   Pt ambulated 200' with RW and supervision, reviewed L knee exercises. Pt doing well with mobility. Will do stair training later today, pt is now too groggy from IV pain medicine. Expect pt will be ready to DC home this afternoon from PT standpoint.  HHPT recommended.   *    Follow Up Recommendations Home health PT    Equipment Recommendations  None recommended by PT    Recommendations for Other Services       Precautions / Restrictions Precautions Precautions: Fall Restrictions LLE Weight Bearing: Weight bearing as tolerated      Mobility  Bed Mobility               General bed mobility comments: up in chair  Transfers Overall transfer level: Needs assistance   Transfers: Sit to/from Stand Sit to Stand: Supervision         General transfer comment: cues for hand placement  Ambulation/Gait Ambulation/Gait assistance: Supervision Ambulation Distance (Feet): 200 Feet Assistive device: Rolling walker (2 wheeled) Gait Pattern/deviations: Step-through pattern;Decreased weight shift to left Gait velocity: WFL   General Gait Details: cues for heel strike LLE and to flex L knee during swing phase  Stairs            Wheelchair Mobility    Modified Rankin (Stroke Patients Only)       Balance                                             Pertinent Vitals/Pain Pain Assessment: 0-10 Pain Score: 7  Pain  Location: L knee with walking Pain Descriptors / Indicators: Sore Pain Intervention(s): Limited activity within patient's tolerance;Monitored during session;Ice applied;Patient requesting pain meds-RN notified;Repositioned    Home Living Family/patient expects to be discharged to:: Private residence Living Arrangements: Spouse/significant other Available Help at Discharge: Family;Available PRN/intermittently Type of Home: House Home Access: Stairs to enter Entrance Stairs-Rails: None Entrance Stairs-Number of Steps: 3 Home Layout: Two level;Able to live on main level with bedroom/bathroom Home Equipment: Gilford Rile - 2 wheels;Bedside commode;Cane - single point      Prior Function Level of Independence: Independent               Hand Dominance        Extremity/Trunk Assessment   Upper Extremity Assessment: Overall WFL for tasks assessed           Lower Extremity Assessment: LLE deficits/detail   LLE Deficits / Details: knee extension AROM -15*, flexion AAROM 90*, SLR 3/5, ankle PF/DF WNL, knee ext -3/5  Cervical / Trunk Assessment: Kyphotic  Communication   Communication: No difficulties  Cognition Arousal/Alertness: Awake/alert Behavior During Therapy: WFL for tasks assessed/performed Overall Cognitive Status: Within Functional Limits for tasks assessed                      General Comments  Exercises Total Joint Exercises Ankle Circles/Pumps: AROM;Both;10 reps;Supine Quad Sets: AROM;Both;5 reps;Supine Towel Squeeze: AROM;Both;5 reps;Supine Heel Slides: AAROM;Left;10 reps;Supine Straight Leg Raises: AAROM;Left;5 reps Long Arc Quad: AROM;Left;5 reps;Seated Knee Flexion: AAROM;Left;5 reps;Seated Goniometric ROM: 15-90* L knee AAROM      Assessment/Plan    PT Assessment All further PT needs can be met in the next venue of care  PT Diagnosis Acute pain   PT Problem List Decreased strength;Decreased range of motion;Decreased activity  tolerance;Pain;Decreased mobility  PT Treatment Interventions     PT Goals (Current goals can be found in the Care Plan section) Acute Rehab PT Goals Patient Stated Goal: ballroom/square dancing, fishing PT Goal Formulation: With patient/family Time For Goal Achievement: 05/03/14 Potential to Achieve Goals: Good    Frequency     Barriers to discharge        Co-evaluation               End of Session Equipment Utilized During Treatment: Gait belt Activity Tolerance: Patient tolerated treatment well Patient left: in chair;with call bell/phone within reach;with family/visitor present Nurse Communication: Mobility status         Time: 8295-6213 PT Time Calculation (min): 37 min   Charges:   PT Evaluation $Initial PT Evaluation Tier I: 1 Procedure PT Treatments $Gait Training: 8-22 mins $Therapeutic Exercise: 8-22 mins   PT G Codes:          David Stone 04/19/2014, 10:00 AM [phone]

## 2014-04-21 NOTE — Addendum Note (Signed)
Addendum created 04/21/14 1454 by Kennieth Radobert E Dajion Bickford, MD   Modules edited: Anesthesia Attestations

## 2014-04-27 NOTE — Discharge Summary (Signed)
SPORTS MEDICINE & JOINT REPLACEMENT   Georgena Spurling, MD   Altamese Cabal, PA-C [address] [place], [place], [place]  [address]                             [phone]  PATIENT ID: David Stone        MRN:  [id number]          DOB/AGE: [birth year] / 78 y.o.    DISCHARGE SUMMARY  ADMISSION DATE:    04/18/2014 DISCHARGE DATE:  04/19/2014  ADMISSION DIAGNOSIS: arthrofibrosis left total knee    DISCHARGE DIAGNOSIS:  arthrofibrosis left total knee    ADDITIONAL DIAGNOSIS: Active Problems:   Left knee pain  Past Medical History  Diagnosis Date  . Hypertension   . History of kidney stones   . Gout   . Arthritis   . Headache     after 63.  Marland Kitchen TIA (transient ischemic attack)     PROCEDURE: Procedure(s): EXCISIONAL TOTAL KNEE ARTHROPLASTY POLYEXCHANGE WITH SCAR TISSUE DEBRIDEMENT on 04/18/2014  CONSULTS:     HISTORY:  See H&P in chart  HOSPITAL COURSE:  Robert Sperl is a 80 y.o. admitted on 04/18/2014 and found to have a diagnosis of arthrofibrosis left total knee.  After appropriate laboratory studies were obtained  they were taken to the operating room on 04/18/2014 and underwent Procedure(s): EXCISIONAL TOTAL KNEE ARTHROPLASTY POLYEXCHANGE WITH SCAR TISSUE DEBRIDEMENT.   They were given perioperative antibiotics:  Anti-infectives    Start     Dose/Rate Route Frequency Ordered Stop   04/18/14 1730  ceFAZolin (ANCEF) IVPB 2 g/50 mL premix     2 g100 mL/hr over 30 Minutes Intravenous Every 6 hours 04/18/14 1720 04/19/14 0529   04/18/14 0600  ceFAZolin (ANCEF) IVPB 2 g/50 mL premix     2 g100 mL/hr over 30 Minutes Intravenous On call to O.R. 04/17/14 1239 04/18/14 1035    .  Tolerated the procedure well.  Placed with a foley intraoperatively.  Given Ofirmev at induction and for 48 hours.    POD# 1: Vital signs were stable.  Patient denied Chest pain, shortness of breath, or calf pain.  Patient was started on Lovenox 30 mg subcutaneously twice daily at 8am.  Consults to  PT, OT, and care management were made.  The patient was weight bearing as tolerated.  CPM was placed on the operative leg 0-90 degrees for 6-8 hours a day.  Incentive spirometry was taught.  Dressing was changed.  Hemovac was discontinued.      POD #2, Continued  PT for ambulation and exercise program.  IV saline locked.  O2 discontinued.    The remainder of the hospital course was dedicated to ambulation and strengthening.   The patient was discharged on 1 day post op in  Good condition.  Blood products given:none  DIAGNOSTIC STUDIES: Recent vital signs: No data found.      Recent laboratory studies: No results for input(s): WBC, HGB, HCT, PLT in the last 168 hours. No results for input(s): NA, K, CL, CO2, BUN, CREATININE, GLUCOSE, CALCIUM in the last 168 hours. Lab Results  Component Value Date   INR 0.96 04/08/2014     Recent Radiographic Studies :  Dg Chest 2 View  04/08/2014   CLINICAL DATA:  Preop left knee surgery  EXAM: CHEST  2 VIEW  COMPARISON:  None  FINDINGS: Heart size appears normal. There is no pleural effusion or edema identified. No  airspace consolidation identified. The lung volumes appear low. There is a compression fracture within the lower thoracic spine with loss of approximately 30% of the vertebral body height. This is age indeterminate.  IMPRESSION: 1. No acute cardiopulmonary abnormalities. 2. Age-indeterminate lower thoracic spine compression fracture.   Electronically Signed   By: Signa Kellaylor  Stroud M.D.   On: 04/08/2014 13:36    DISCHARGE INSTRUCTIONS: Discharge Instructions    CPM    Complete by:  As directed   Continuous passive motion machine (CPM):      Use the CPM from 0 to 90 for 6-8 hours per day.      You may increase by 10 per day.  You may break it up into 2 or 3 sessions per day.      Use CPM for 2 weeks or until you are told to stop.     Call MD / Call 911    Complete by:  As directed   If you experience chest pain or shortness of breath,  CALL 911 and be transported to the hospital emergency room.  If you develope a fever above 101 F, pus (white drainage) or increased drainage or redness at the wound, or calf pain, call your surgeon's office.     Change dressing    Complete by:  As directed   Change dressing on wednesday, then change the dressing daily with sterile 4 x 4 inch gauze dressing and apply TED hose.     Constipation Prevention    Complete by:  As directed   Drink plenty of fluids.  Prune juice may be helpful.  You may use a stool softener, such as Colace (over the counter) 100 mg twice a day.  Use MiraLax (over the counter) for constipation as needed.     Diet - low sodium heart healthy    Complete by:  As directed      Do not put a pillow under the knee. Place it under the heel.    Complete by:  As directed      Driving restrictions    Complete by:  As directed   No driving for 6 weeks     Increase activity slowly as tolerated    Complete by:  As directed      Lifting restrictions    Complete by:  As directed   No lifting for 6 weeks     TED hose    Complete by:  As directed   Use stockings (TED hose) for 3 weeks on both leg(s).  You may remove them at night for sleeping.           DISCHARGE MEDICATIONS:     Medication List    TAKE these medications        acetaminophen 500 MG tablet  Commonly known as:  TYLENOL  Take 1,000 mg by mouth every 6 (six) hours as needed for moderate pain.     allopurinol 300 MG tablet  Commonly known as:  ZYLOPRIM  Take 300 mg by mouth daily.     aspirin EC 81 MG tablet  Take 81 mg by mouth daily.     enoxaparin 40 MG/0.4ML injection  Commonly known as:  LOVENOX  Inject 0.4 mLs (40 mg total) into the skin daily.     EYE DROPS ADVANCED RELIEF OP  Place 2 drops into both eyes daily as needed (for eye relief).     loratadine 10 MG tablet  Commonly known as:  CLARITIN  Take  10 mg by mouth daily.     losartan 25 MG tablet  Commonly known as:  COZAAR  Take 25  mg by mouth 2 (two) times daily.     methocarbamol 500 MG tablet  Commonly known as:  ROBAXIN  Take 1-2 tablets (500-1,000 mg total) by mouth every 6 (six) hours as needed for muscle spasms.     MULTIVITAMIN PO  Take 1 tablet by mouth daily.     niacin 500 MG tablet  Take 500 mg by mouth daily.     oxyCODONE 5 MG immediate release tablet  Commonly known as:  Oxy IR/ROXICODONE  Take 1-2 tablets (5-10 mg total) by mouth every 3 (three) hours as needed for breakthrough pain.     OxyCODONE 10 mg T12a 12 hr tablet  Commonly known as:  OXYCONTIN  Take 1 tablet (10 mg total) by mouth every 12 (twelve) hours.        FOLLOW UP VISIT:       Follow-up Information    [facility]   Why:  [facility] will contact you concerning start date and time for physical therapy.   Contact information:   [address] [place] [address] [phone]       Follow up with Raymon MuttonLUCEY,STEPHEN D, MD. Call on 05/03/2014.   Specialty:  Orthopedic Surgery   Contact information:   [address] [place] [address] [phone]       DISPOSITION: HOME  CONDITION:  Good   Keeli Roberg 04/27/2014, 9:40 PM

## 2016-07-12 ENCOUNTER — Other Ambulatory Visit: Payer: Self-pay | Admitting: Orthopedic Surgery

## 2016-07-12 DIAGNOSIS — G8929 Other chronic pain: Secondary | ICD-10-CM

## 2016-07-12 DIAGNOSIS — M25561 Pain in right knee: Principal | ICD-10-CM

## 2016-07-15 ENCOUNTER — Ambulatory Visit (INDEPENDENT_AMBULATORY_CARE_PROVIDER_SITE_OTHER): Payer: Medicare HMO

## 2016-07-15 DIAGNOSIS — M2241 Chondromalacia patellae, right knee: Secondary | ICD-10-CM | POA: Diagnosis not present

## 2016-07-15 DIAGNOSIS — M23231 Derangement of other medial meniscus due to old tear or injury, right knee: Secondary | ICD-10-CM | POA: Diagnosis not present

## 2016-07-15 DIAGNOSIS — G8929 Other chronic pain: Secondary | ICD-10-CM

## 2016-07-15 DIAGNOSIS — M7041 Prepatellar bursitis, right knee: Secondary | ICD-10-CM

## 2016-07-15 DIAGNOSIS — M25561 Pain in right knee: Principal | ICD-10-CM

## 2017-08-30 IMAGING — MR MR KNEE*R* W/O CM
5 series · 40 of 40 positions shown · non-contrast
Comparison: None.

CLINICAL DATA: Chronic right knee pain.  Six L with the

EXAM:
MRI OF THE RIGHT KNEE WITHOUT CONTRAST
TECHNIQUE: Multiplanar, multisequence MR imaging of the knee was performed. No
intravenous contrast was administered.

[Series 3: PD fat-sat · axial · 3.0mm · 0.33mm/px · z∈[-48,+68]mm · 8 of 36 slices shown (1 of 3)]
[im 1/36]
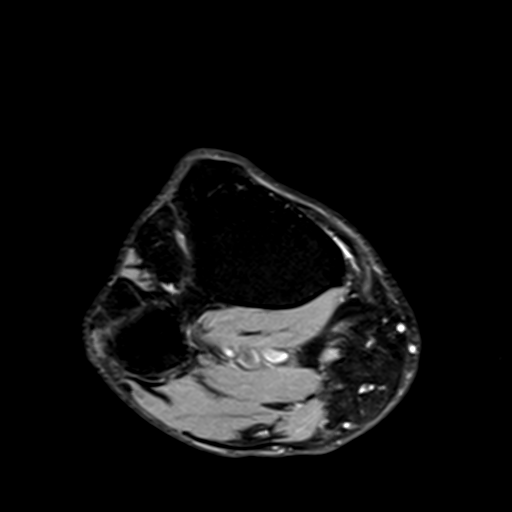
[im 6/36]
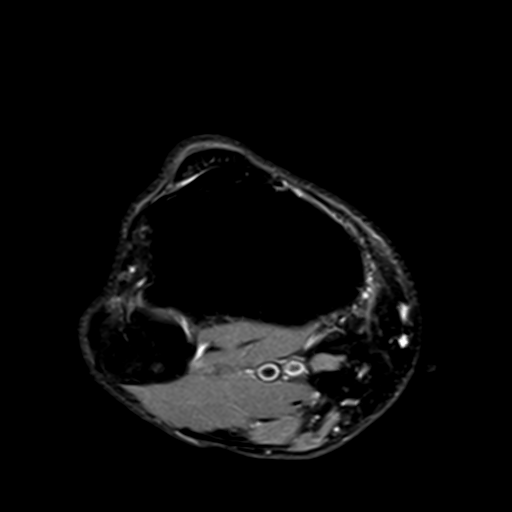
[im 11/36]
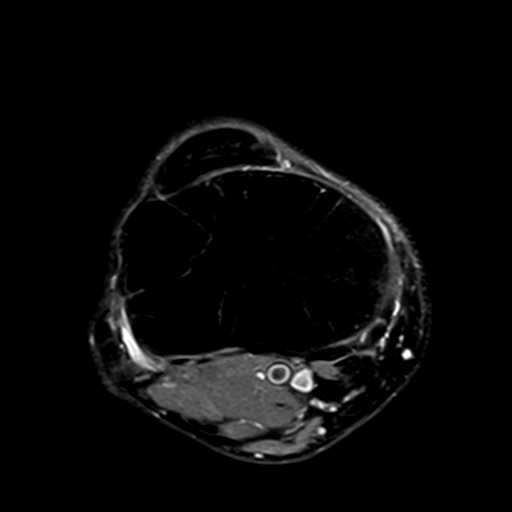
[im 16/36]
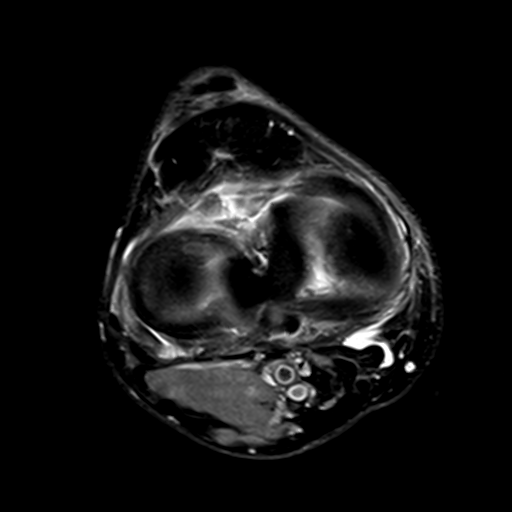
[im 21/36]
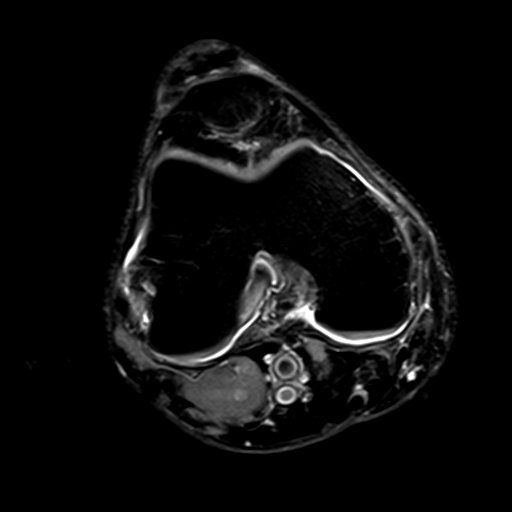
[im 26/36]
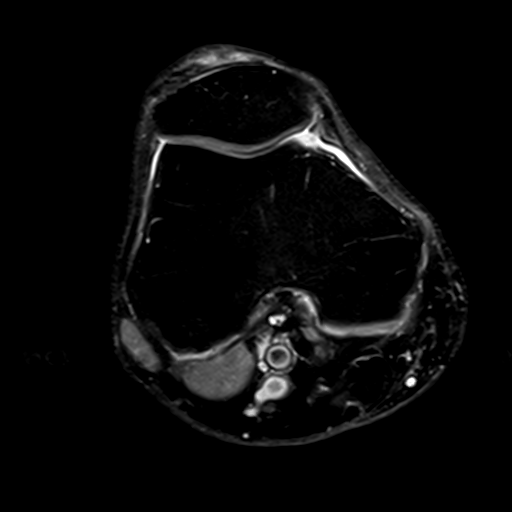
[im 31/36]
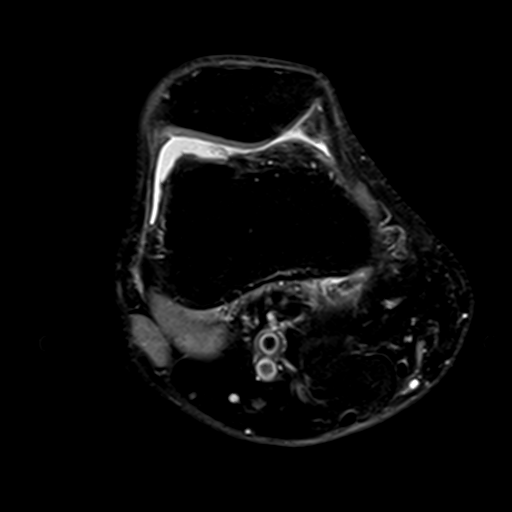
[im 36/36]
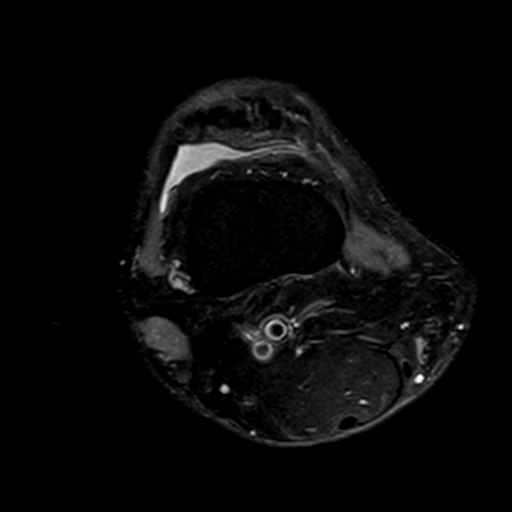

[Series 4: T1 · coronal · 3.0mm · 0.50mm/px · 8 of 36 slices shown]
[im 1/36]
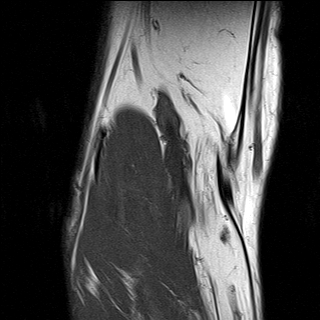
[im 6/36]
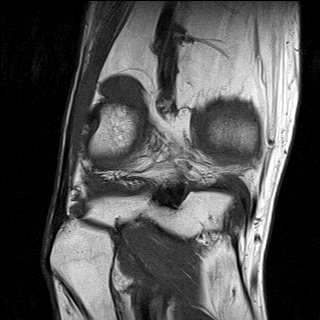
[im 11/36]
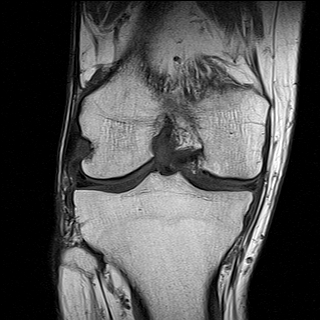
[im 16/36]
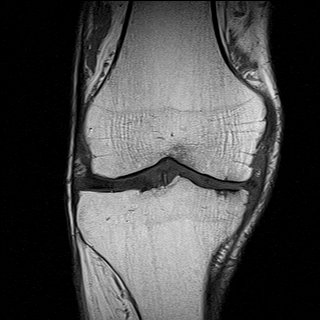
[im 21/36]
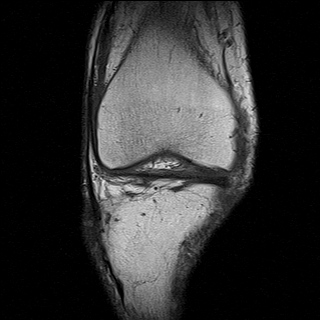
[im 26/36]
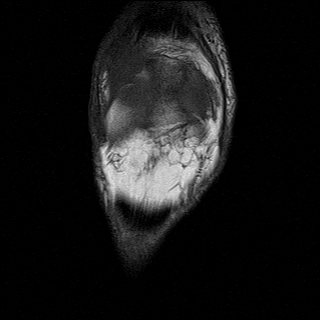
[im 31/36]
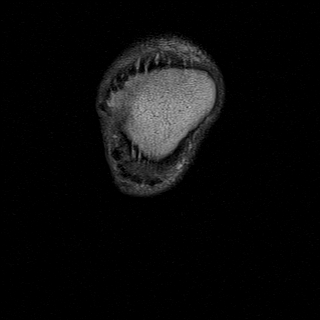
[im 36/36]
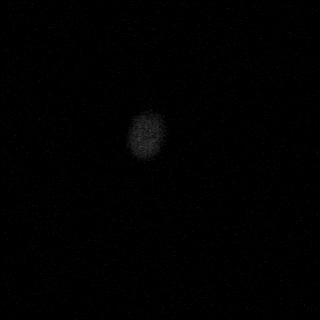

[Series 5: T2 fat-sat · coronal · 3.0mm · 0.50mm/px · 8 of 36 slices shown]
[im 1/36]
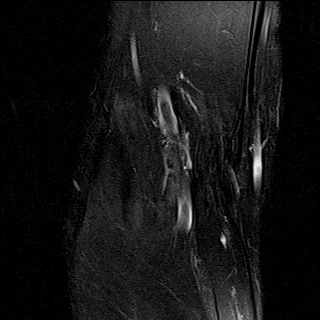
[im 6/36]
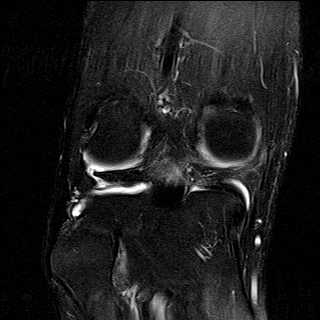
[im 11/36]
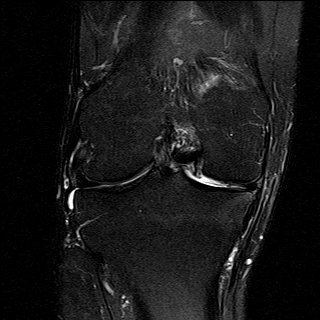
[im 16/36]
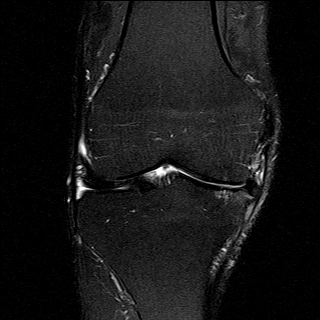
[im 21/36]
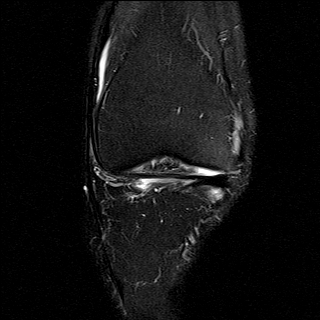
[im 26/36]
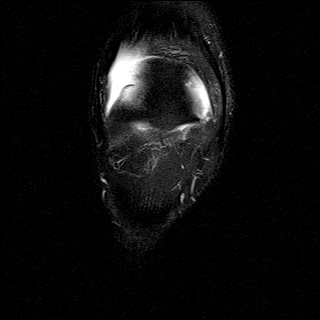
[im 31/36]
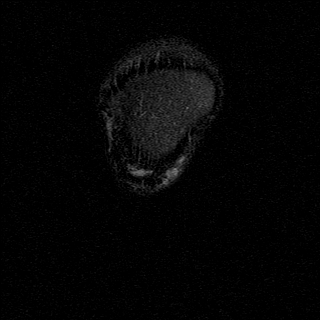
[im 36/36]
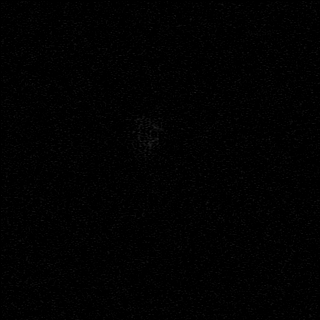

[Series 6: PD fat-sat · coronal · 3.0mm · 0.62mm/px · 8 of 36 slices shown (2 of 3)]
[im 1/36]
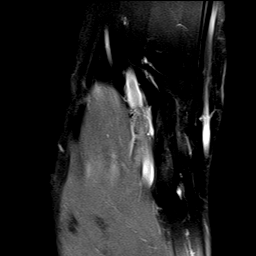
[im 6/36]
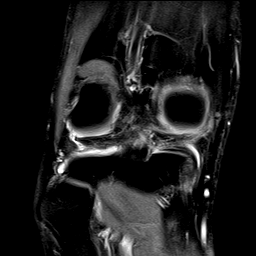
[im 11/36]
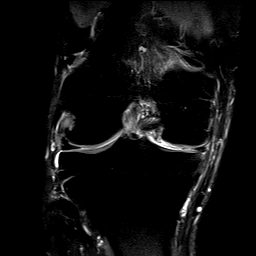
[im 16/36]
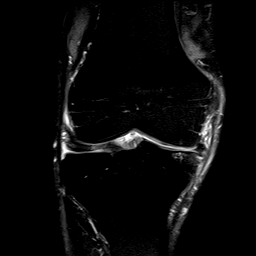
[im 21/36]
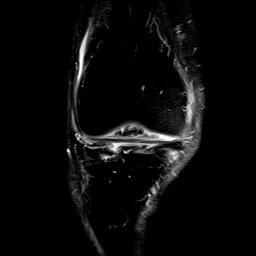
[im 26/36]
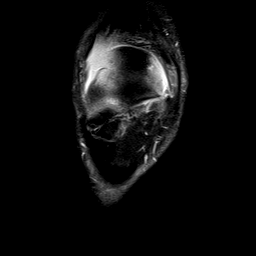
[im 31/36]
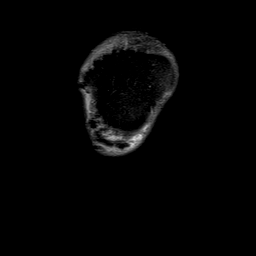
[im 36/36]
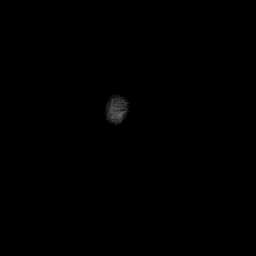

[Series 7: PD fat-sat · sagittal · 3.0mm · 0.62mm/px · 8 of 37 slices shown (3 of 3)]
[im 1/37]
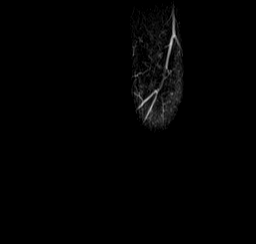
[im 6/37]
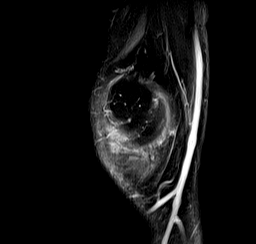
[im 11/37]
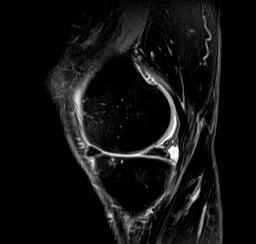
[im 16/37]
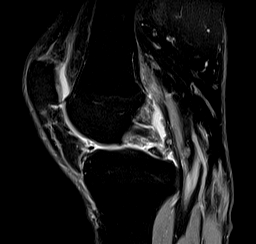
[im 21/37]
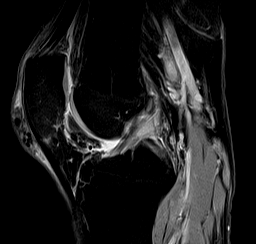
[im 26/37]
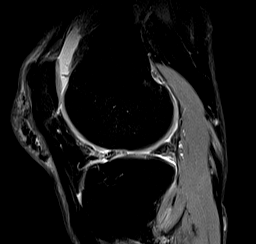
[im 31/37]
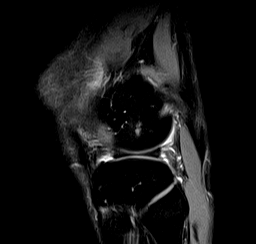
[im 37/37]
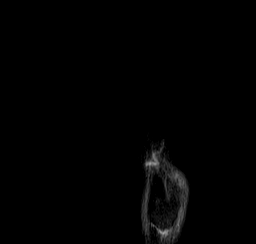

[40 of 40 positions shown; findings below may reference images not displayed]

FINDINGS: MENISCI

Medial meniscus: There is a tear of the free edge of the midbody
with the torn component flipped into the inferior gutter best seen
on images 13-15 of series 5.

Lateral meniscus:  Intact.

LIGAMENTS

Cruciates:  Intact ACL and PCL.

Collaterals: Medial collateral ligament is intact. Lateral
collateral ligament complex is intact.

CARTILAGE

Patellofemoral: Small focal area of grade 4 chondromalacia of the
medial facet of the patella.

Medial: Focal grade 4 chondromalacia of the anterior aspect of the
tibial plateau. Irregular thinning of the cartilage throughout the
joint.

Lateral:  Normal.

Joint:  Small joint effusion.

Popliteal Fossa:  Tiny Baker's cyst.

Extensor Mechanism:  Intact quadriceps tendon and patellar tendon.

Bones:  No significant abnormalities.

Other: Multiple calcifications in the prepatellar bursa.
IMPRESSION: 1. Calcific prepatellar bursitis.
2. Tear of the midbody of the medial meniscus as described.
3. Chondromalacia in the patellofemoral and medial compartments.
